# Patient Record
Sex: Female | Born: 1990 | Race: White | Hispanic: No | Marital: Single
Health system: Southern US, Community
[De-identification: ages and names within clinical notes are randomized; demographics above are authoritative.]

## PROBLEM LIST (undated history)

## (undated) DIAGNOSIS — F419 Anxiety disorder, unspecified: Secondary | ICD-10-CM

## (undated) DIAGNOSIS — J45909 Unspecified asthma, uncomplicated: Secondary | ICD-10-CM

## (undated) HISTORY — PX: WISDOM TOOTH EXTRACTION: SHX21

---
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
## 2013-01-15 ENCOUNTER — Ambulatory Visit: Payer: Self-pay | Admitting: Obstetrics and Gynecology

## 2013-05-08 ENCOUNTER — Encounter (HOSPITAL_COMMUNITY): Payer: Self-pay | Admitting: Emergency Medicine

## 2013-05-08 ENCOUNTER — Emergency Department (INDEPENDENT_AMBULATORY_CARE_PROVIDER_SITE_OTHER): Payer: BC Managed Care – PPO

## 2013-05-08 ENCOUNTER — Emergency Department (HOSPITAL_COMMUNITY)
Admission: EM | Admit: 2013-05-08 | Discharge: 2013-05-08 | Disposition: A | Discharge status: Home / Self Care | Payer: BC Managed Care – PPO | Source: Home / Self Care | Attending: Emergency Medicine | Admitting: Emergency Medicine

## 2013-05-08 DIAGNOSIS — R509 Fever, unspecified: Secondary | ICD-10-CM

## 2013-05-08 HISTORY — DX: Anxiety disorder, unspecified: F41.9

## 2013-05-08 LAB — CBC WITH DIFFERENTIAL/PLATELET
BASOS ABS: 0 10*3/uL (ref 0.0–0.1)
BASOS PCT: 0 % (ref 0–1)
Eosinophils Absolute: 0.1 10*3/uL (ref 0.0–0.7)
Eosinophils Relative: 1 % (ref 0–5)
HCT: 41.6 % (ref 36.0–46.0)
Hemoglobin: 14.4 g/dL (ref 12.0–15.0)
LYMPHS PCT: 10 % — AB (ref 12–46)
Lymphs Abs: 0.7 10*3/uL (ref 0.7–4.0)
MCH: 31.6 pg (ref 26.0–34.0)
MCHC: 34.6 g/dL (ref 30.0–36.0)
MCV: 91.2 fL (ref 78.0–100.0)
Monocytes Absolute: 0.3 10*3/uL (ref 0.1–1.0)
Monocytes Relative: 5 % (ref 3–12)
NEUTROS ABS: 5.9 10*3/uL (ref 1.7–7.7)
NEUTROS PCT: 84 % — AB (ref 43–77)
PLATELETS: 161 10*3/uL (ref 150–400)
RBC: 4.56 MIL/uL (ref 3.87–5.11)
RDW: 12.5 % (ref 11.5–15.5)
WBC: 7 10*3/uL (ref 4.0–10.5)

## 2013-05-08 LAB — POCT URINALYSIS DIP (DEVICE)
Glucose, UA: NEGATIVE mg/dL
HGB URINE DIPSTICK: NEGATIVE
Leukocytes, UA: NEGATIVE
Nitrite: NEGATIVE
PH: 6 (ref 5.0–8.0)
PROTEIN: NEGATIVE mg/dL
SPECIFIC GRAVITY, URINE: 1.02 (ref 1.005–1.030)
Urobilinogen, UA: 0.2 mg/dL (ref 0.0–1.0)

## 2013-05-08 LAB — POCT PREGNANCY, URINE: PREG TEST UR: NEGATIVE

## 2013-05-08 MED ORDER — CEFTRIAXONE SODIUM 250 MG IJ SOLR
250.0000 mg | Freq: Once | INTRAMUSCULAR | Status: AC
  Administered 2013-05-08: 250 mg via INTRAMUSCULAR

## 2013-05-08 MED ORDER — HYDROCODONE-ACETAMINOPHEN 5-325 MG PO TABS: ORAL_TABLET | ORAL | Status: AC

## 2013-05-08 MED ORDER — ACETAMINOPHEN 325 MG PO TABS
650.0000 mg | ORAL_TABLET | Freq: Once | ORAL | Status: AC
  Administered 2013-05-08: 650 mg via ORAL

## 2013-05-08 MED ORDER — LIDOCAINE HCL (PF) 1 % IJ SOLN
INTRAMUSCULAR | Status: AC
  Filled 2013-05-08: qty 5

## 2013-05-08 MED ORDER — CEFTRIAXONE SODIUM 250 MG IJ SOLR
INTRAMUSCULAR | Status: AC
  Filled 2013-05-08: qty 250

## 2013-05-08 MED ORDER — OSELTAMIVIR PHOSPHATE 75 MG PO CAPS: 75.0000 mg | ORAL_CAPSULE | Freq: Two times a day (BID) | ORAL | Status: AC

## 2013-05-08 MED ORDER — ACETAMINOPHEN 325 MG PO TABS
ORAL_TABLET | ORAL | Status: AC
  Filled 2013-05-08: qty 2

## 2013-05-08 MED ORDER — ONDANSETRON 8 MG PO TBDP: 8.0000 mg | ORAL_TABLET | Freq: Three times a day (TID) | ORAL | Status: AC | PRN

## 2013-05-08 MED ORDER — CIPROFLOXACIN HCL 500 MG PO TABS: 500.0000 mg | ORAL_TABLET | Freq: Two times a day (BID) | ORAL | Status: AC

## 2013-05-08 NOTE — ED Provider Notes (Signed)
Chief Complaint   Chief Complaint  Patient presents with  . 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 She denies any sore throat, adenopathy, cough, abdominal pain, dysuria, frequency, urgency, hematuria, skin rash, or history of a tick bite.  Review of Systems   Other than as noted above, the patient denies any of the following symptoms. Systemic:  No sweats, fatigue, myalgias, headache, weight loss or anorexia. Eye:  No redness or drainage. ENT:  No earache, nasal congestion, rhinorrhea, sinus pressure, or sore throat. No adenopathy or stiff neck. Lungs:  No cough, sputum production, wheezing, shortness of breath.  Cardiovascular:  No chest pain. GI:  No nausea, vomiting, abdominal pain or diarrhea. GU:  No dysuria, frequency, or hematuria. Skin:  No rash.  PMFSH   Past medical history, family history, social history, meds, and allergies were reviewed. She takes Lexapro and methylphenidate.  Physical Examination     Vital signs:  BP 115/63  Pulse 109  Temp(Src) 101.9 F (38.8 C) (Oral)  Resp 24  SpO2 100%  LMP 04/23/2013 General:  Alert, in no distress. Eye:  PERRL, full EOMs.  Lids and conjunctivas were normal. ENT:  TMs and canals were normal, without erythema or inflammation.  Nasal mucosa was clear and uncongested, without drainage.  Mucous membranes were moist.  Pharynx was clear, without exudate or drainage.  There were no oral ulcerations or lesions. Neck:  Supple, no adenopathy, tenderness or mass. Thyroid was normal. Lungs:  No respiratory distress.  Lungs were clear to auscultation, without wheezes, rales or rhonchi.  Breath sounds were clear and equal  bilaterally. Heart:  Regular rhythm, without gallops, murmers or rubs. Abdomen:  Soft, flat, with mild tenderness to palpation in the right flank area without guarding or rebound, bowel sounds were normal.  No hepatosplenomagaly or mass. Back: Moderate right CVA tenderness. Extremities:  No swelling, erythema, or joint pain to palpation. Skin:  Clear, warm, and dry, without rash or lesions.  Labs   Results for orders placed during the hospital encounter of 05/08/13  CBC WITH DIFFERENTIAL      Result Value Ref Range   WBC 7.0  4.0 - 10.5 K/uL   RBC 4.56  3.87 - 5.11 MIL/uL   Hemoglobin 14.4  12.0 - 15.0 g/dL   HCT 16.1  09.6 - 04.5 %   MCV 91.2  78.0 - 100.0 fL   MCH 31.6  26.0 - 34.0 pg   MCHC 34.6  30.0 - 36.0 g/dL   RDW 40.9  81.1 - 91.4 %   Platelets 161  150 - 400 K/uL   Neutrophils Relative % 84 (*) 43 - 77 %   Neutro Abs 5.9  1.7 - 7.7 K/uL   Lymphocytes Relative 10 (*) 12 - 46 %   Lymphs Abs 0.7  0.7 - 4.0 K/uL   Monocytes Relative 5  3 - 12 %   Monocytes Absolute 0.3  0.1 - 1.0 K/uL   Eosinophils Relative 1  0 - 5 %   Eosinophils Absolute 0.1  0.0 - 0.7 K/uL   Basophils Relative 0  0 - 1 %   Basophils Absolute 0.0  0.0 - 0.1 K/uL  POCT URINALYSIS DIP (DEVICE)  Result Value Ref Range   Glucose, UA NEGATIVE  NEGATIVE mg/dL   Bilirubin Urine SMALL (*) NEGATIVE   Ketones, ur >=160 (*) NEGATIVE mg/dL   Specific Gravity, Urine 1.020  1.005 - 1.030   Hgb urine dipstick NEGATIVE  NEGATIVE   pH 6.0  5.0 - 8.0   Protein, ur NEGATIVE  NEGATIVE mg/dL   Urobilinogen, UA 0.2  0.0 - 1.0 mg/dL   Nitrite NEGATIVE  NEGATIVE   Leukocytes, UA NEGATIVE  NEGATIVE  POCT PREGNANCY, URINE      Result Value Ref Range   Preg Test, Ur NEGATIVE  NEGATIVE    Urine was cultured.  Radiology   Dg Chest 2 View  05/08/2013   CLINICAL DATA:  Two day history of fever, chest pain  EXAM: CHEST  2 VIEW  COMPARISON:  None.  FINDINGS: The lungs are clear and negative for focal airspace  consolidation, pulmonary edema or suspicious pulmonary nodule. No pleural effusion or pneumothorax. Cardiac and mediastinal contours are within normal limits. No acute fracture or lytic or blastic osseous lesions. The visualized upper abdominal bowel gas pattern is unremarkable.  IMPRESSION: Negative.   Electronically Signed   By: Malachy MoanHeath  McCullough M.D.   On: 05/08/2013 22:14   Course in Urgent Care Center   Given Rocephin 1 g IM.  Assessment   The encounter diagnosis was Fever.  Differential diagnosis is influenza or other viral illness, UTI or acute pyelonephritis. No evidence for pneumonia. We'll treat for both pyelonephritis and influenza, pending results of urine culture. She's to return in 48 hours for recheck. She should return immediately if she gets worse in any way.  Plan   1.  Meds:  The following meds were prescribed:   New Prescriptions   CIPROFLOXACIN (CIPRO) 500 MG TABLET    Take 1 tablet (500 mg total) by mouth every 12 (twelve) hours.   HYDROCODONE-ACETAMINOPHEN (NORCO/VICODIN) 5-325 MG PER TABLET    1 to 2 tabs every 4 to 6 hours as needed for pain.   ONDANSETRON (ZOFRAN ODT) 8 MG DISINTEGRATING TABLET    Take 1 tablet (8 mg total) by mouth every 8 (eight) hours as needed for nausea.   OSELTAMIVIR (TAMIFLU) 75 MG CAPSULE    Take 1 capsule (75 mg total) by mouth every 12 (twelve) hours.    2.  Patient Education/Counseling:  The patient was given appropriate handouts, self care instructions, and instructed in symptomatic relief.  May use Tylenol or ibuprofen for fever.   3.  Follow up:  The patient was told to follow up here if no better in 2 to 3 days, or sooner if becoming worse in any way, and given some red flag symptoms such as increasing fever, severe headache or stiff neck, difficulty breathing, chest pain, abdominal pain, or persistent vomiting which would prompt immediate return.  Follow up here as necessary.     Reuben Likesavid C Duel Conrad, MD 05/08/13 [phone]

## 2013-05-08 NOTE — ED Notes (Signed)
Fever, chills, reflux, lower back pain, onset a few days ago.  Patient currently is anxious, feeling panicky

## 2013-05-08 NOTE — Discharge Instructions (Signed)
Fever, Adult A fever is a higher than normal body temperature. In an adult, an oral temperature around 98.6 F (37 C) is considered normal. A temperature of 100.4 F (38 C) or higher is generally considered a fever. Mild or moderate fevers generally have no long-term effects and often do not require treatment. Extreme fever (greater than or equal to 106 F or 41.1 C) can cause seizures. The sweating that may occur with repeated or prolonged fever may cause dehydration. Elderly people can develop confusion during a fever. A measured temperature can vary with:  Age.  Time of day.  Method of measurement (mouth, underarm, rectal, or ear). The fever is confirmed by taking a temperature with a thermometer. Temperatures can be taken different ways. Some methods are accurate and some are not.  An oral temperature is used most commonly. Electronic thermometers are fast and accurate.  An ear temperature will only be accurate if the thermometer is positioned as recommended by the manufacturer.  A rectal temperature is accurate and done for those adults who have a condition where an oral temperature cannot be taken.  An underarm (axillary) temperature is not accurate and not recommended. Fever is a symptom, not a disease.  CAUSES   Infections commonly cause fever.  Some noninfectious causes for fever include:  Some arthritis conditions.  Some thyroid or adrenal gland conditions.  Some immune system conditions.  Some types of cancer.  A medicine reaction.  High doses of certain street drugs such as methamphetamine.  Dehydration.  Exposure to high outside or room temperatures.  Occasionally, the source of a fever cannot be determined. This is sometimes called a "fever of unknown origin" (FUO).  Some situations may lead to a temporary rise in body temperature that may go away on its own. Examples are:  Childbirth.  Surgery.  Intense exercise. HOME CARE INSTRUCTIONS   Take  appropriate medicines for fever. Follow dosing instructions carefully. If you use acetaminophen to reduce the fever, be careful to avoid taking other medicines that also contain acetaminophen. Do not take aspirin for a fever if you are younger than age 19. There is an association with Reye's syndrome. Reye's syndrome is a rare but potentially deadly disease.  If an infection is present and antibiotics have been prescribed, take them as directed. Finish them even if you start to feel better.  Rest as needed.  Maintain an adequate fluid intake. To prevent dehydration during an illness with prolonged or recurrent fever, you may need to drink extra fluid.Drink enough fluids to keep your urine clear or pale yellow.  Sponging or bathing with room temperature water may help reduce body temperature. Do not use ice water or alcohol sponge baths.  Dress comfortably, but do not over-bundle. SEEK MEDICAL CARE IF:   You are unable to keep fluids down.  You develop vomiting or diarrhea.  You are not feeling at least partly better after 3 days.  You develop new symptoms or problems. SEEK IMMEDIATE MEDICAL CARE IF:   You have shortness of breath or trouble breathing.  You develop excessive weakness.  You are dizzy or you faint.  You are extremely thirsty or you are making little or no urine.  You develop new pain that was not there before (such as in the head, neck, chest, back, or abdomen).  You have persistant vomiting and diarrhea for more than 1 to 2 days.  You develop a stiff neck or your eyes become sensitive to light.  You develop a   skin rash.  You have a fever or persistent symptoms for more than 2 to 3 days.  You have a fever and your symptoms suddenly get worse. MAKE SURE YOU:   Understand these instructions.  Will watch your condition.  Will get help right away if you are not doing well or get worse. Document Released: 07/21/2000 Document Revised: 04/19/2011 Document  Reviewed: 11/26/2010 [employer] Patient Information 2014 [employer]  

## 2013-05-12 LAB — URINE CULTURE

## 2013-05-12 NOTE — ED Notes (Signed)
Urine culture: 15,000 colonies Staph species (coagulase neg.).  Pt. adequately treated with Cipro.   Vassie MoselleYork, Isair Inabinet M 05/12/2013

## 2013-05-13 NOTE — Progress Notes (Signed)
Quick Note:  Results are abnormal as noted, but have been adequately treated. No further action necessary. This is a contaminant and does not require any treatment. ______

## 2013-09-19 ENCOUNTER — Encounter: Payer: BC Managed Care – PPO | Admitting: Obstetrics & Gynecology

## 2013-09-20 IMAGING — CT CT ABD-PELV W/ CM
1 of 2 series · 15 of 32 positions shown, 19 images · IV contrast (APPLIED)
Comparison: None.

CLINICAL DATA: Lower abdominal pain, nausea and vomiting for 2
weeks.

CT ABDOMEN AND PELVIS WITH CONTRAST
TECHNIQUE: Multidetector CT imaging of the abdomen and pelvis was
performed following the standard protocol during bolus
administration of intravenous contrast.
Contrast: 1 OMNIPAQUE IOHEXOL 300 MG/ML  SOLN, 100mL OMNIPAQUE
IOHEXOL 300 MG/ML  SOLN

[Series 2: abd/pelv with 5.0 b31f st · axial · 0.59mm/px · z∈[-502,-57]mm · 15 of 99 slices shown, 19 images]
[im 5/99  soft-tissue]
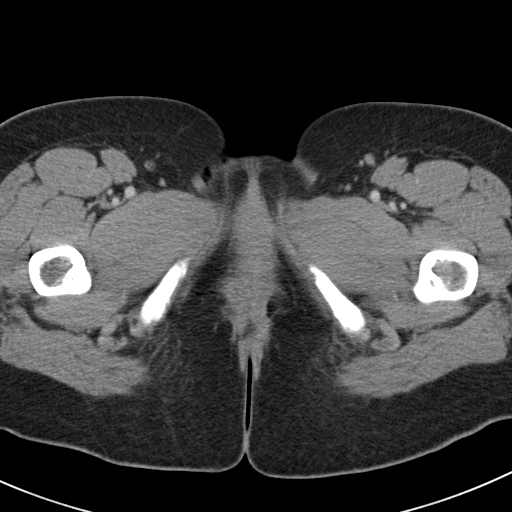
[im 5/99  bone]
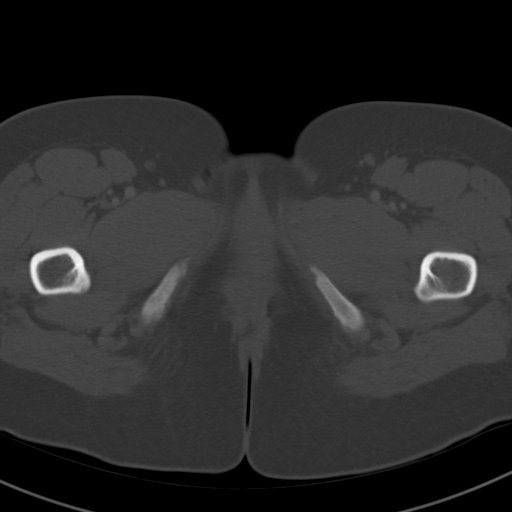
[im 13/99  soft-tissue]
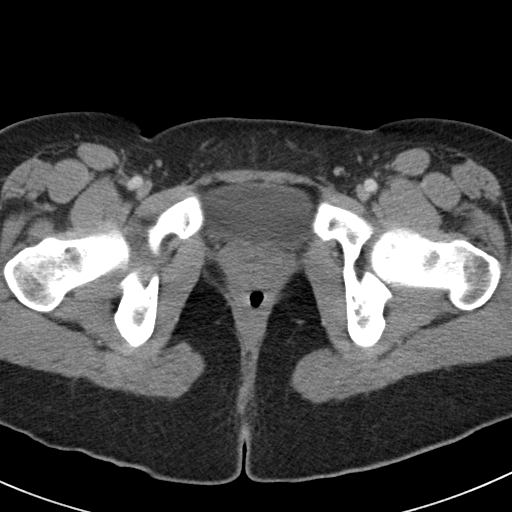
[im 22/99  soft-tissue]
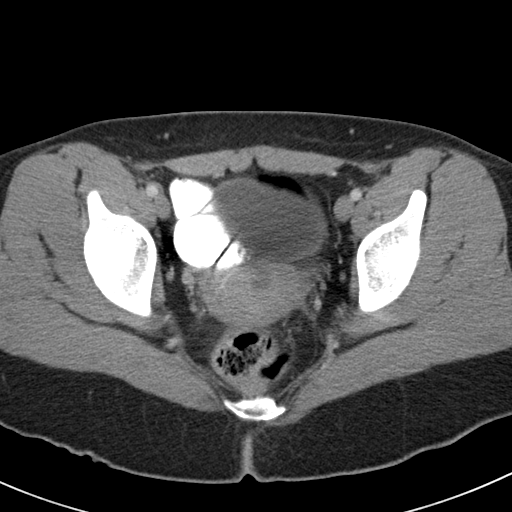
[im 26/99  soft-tissue]
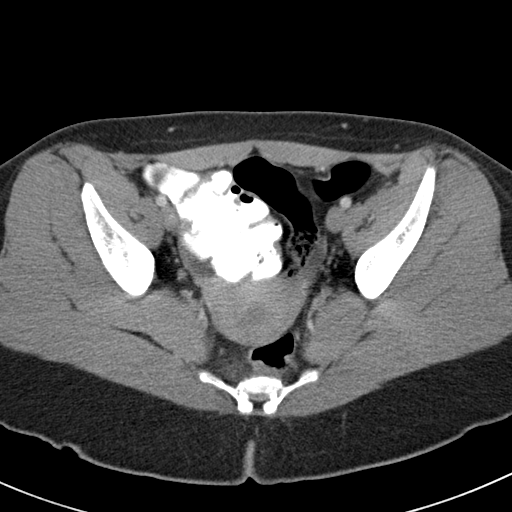
[im 35/99  soft-tissue]
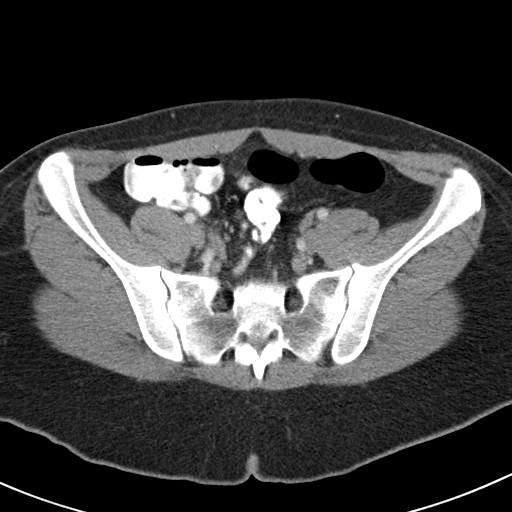
[im 43/99  soft-tissue]
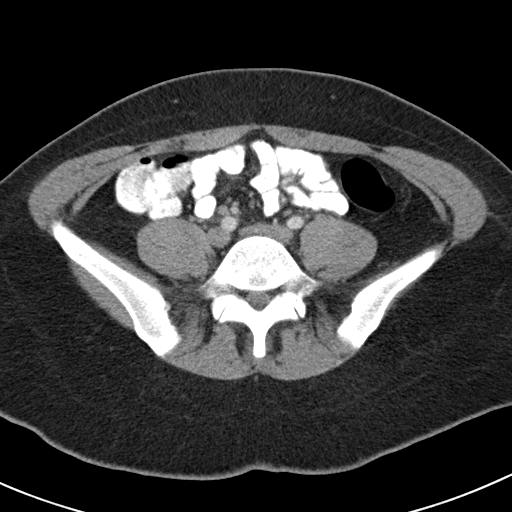
[im 52/99  soft-tissue]
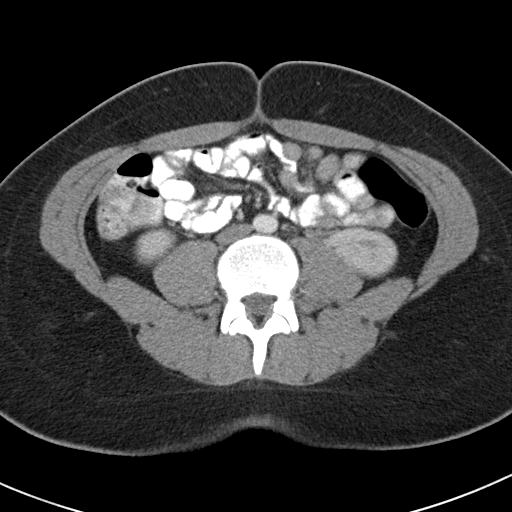
[im 56/99  soft-tissue]
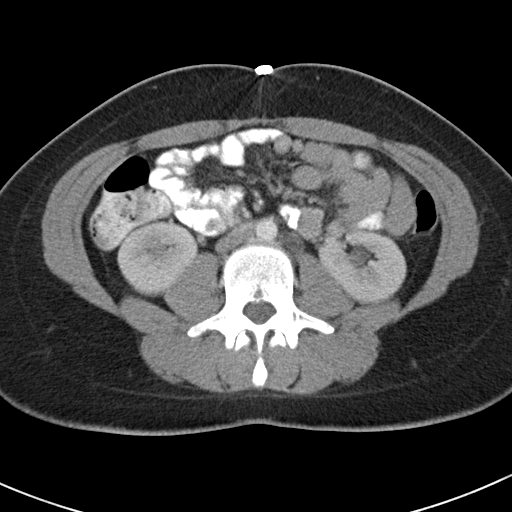
[im 64/99  soft-tissue]
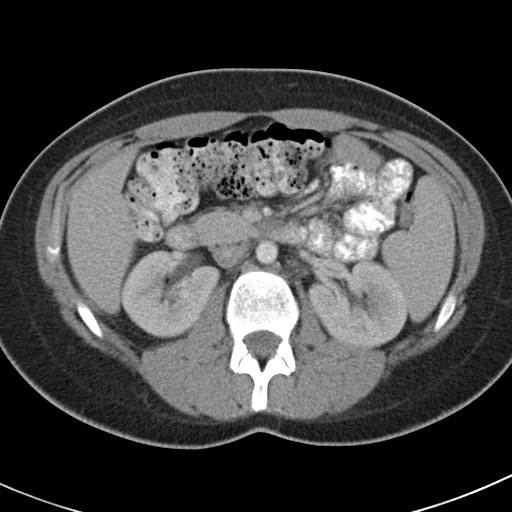
[im 64/99  bone]
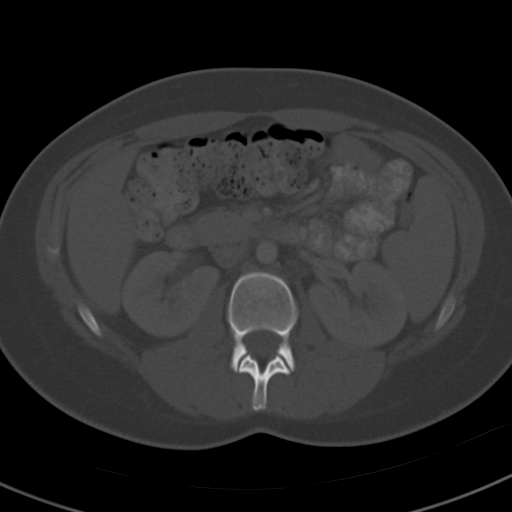
[im 73/99  soft-tissue]
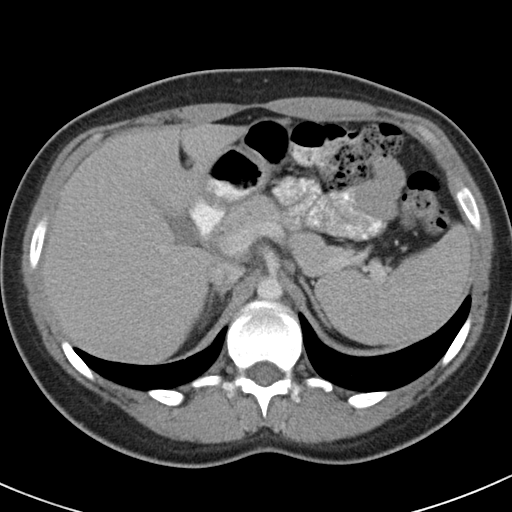
[im 77/99  soft-tissue]
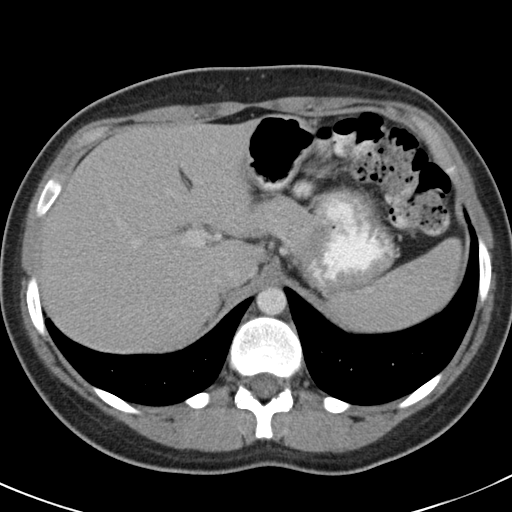
[im 81/99  lung]
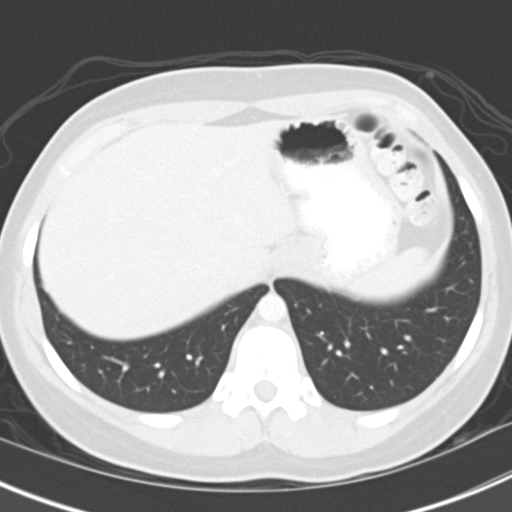
[im 86/99  soft-tissue]
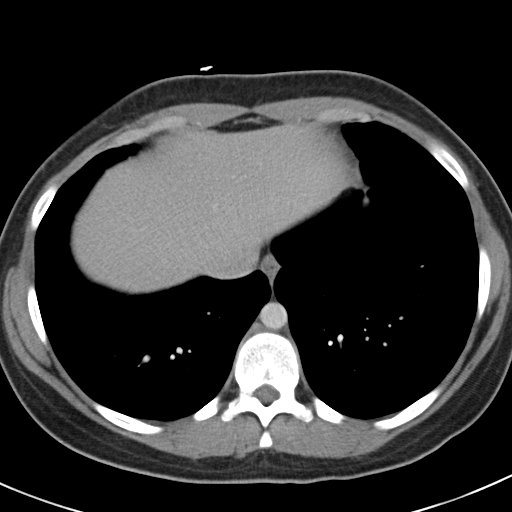
[im 86/99  lung]
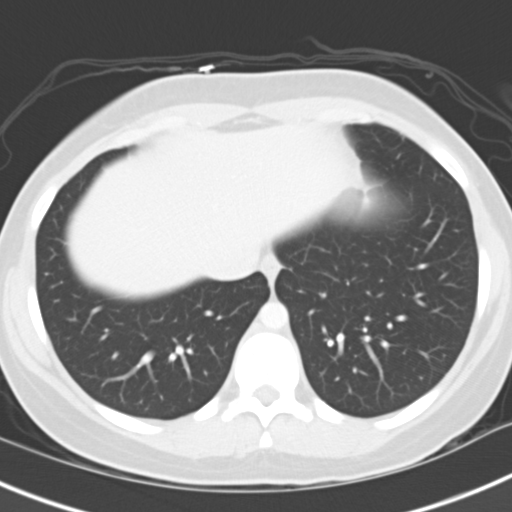
[im 90/99  lung]
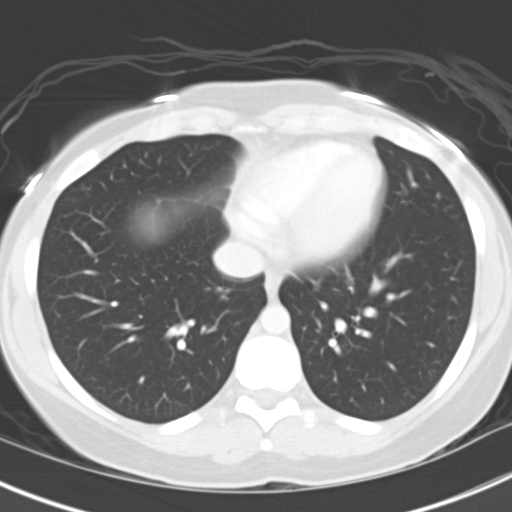
[im 94/99  soft-tissue]
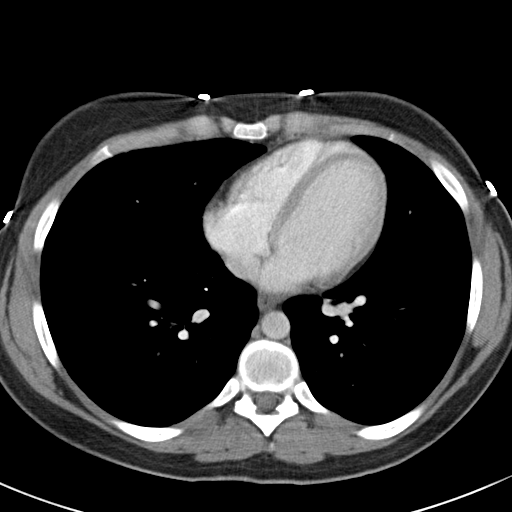
[im 94/99  lung]
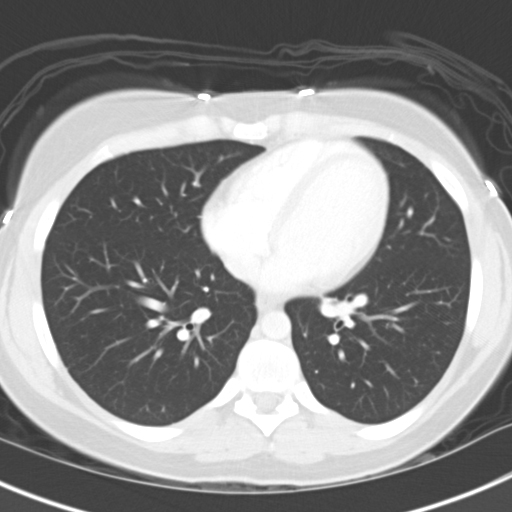

[15 of 32 positions shown; findings below may reference images not displayed]

FINDINGS: The lung bases are clear.

Tiny sub-centimeter low attenuation lesion in the dome of the liver
likely represents a cyst.  No other focal liver lesions. Some
increased density of the bile within the gallbladder suggest
possible sludge.  No stones are identified.  No gallbladder wall
thickening.  The spleen, pancreas, adrenal glands, kidneys,
abdominal aorta, and retroperitoneal lymph nodes are unremarkable.
The stomach, small bowel, and colon are not abnormally distended.
No bowel wall thickening is identified.  No free air or free fluid
in the abdomen.

Pelvis:  The uterus is not enlarged.  Asymmetric prominence of the
right ovary with 2 cm diameter area of low attenuation, probably
represents an ovarian cyst.  Bladder wall is not thickened.  No
free or loculated pelvic fluid collections.  No significant pelvic
lymphadenopathy.  The appendix is normal.  No evidence of
diverticulitis.  Normal alignment of the lumbar vertebrae.
IMPRESSION: No acute process suggested in the abdomen or pelvis. Suggestion of
sludge in the gallbladder.  Probable right ovarian cyst.  Tiny cyst
in the liver.
